# Patient Record
Sex: Female | Born: 1995 | Race: Black or African American | Hispanic: No | Marital: Single
Health system: Southern US, Community
[De-identification: ages and names within clinical notes are randomized; demographics above are authoritative.]

## PROBLEM LIST (undated history)

## (undated) DIAGNOSIS — F419 Anxiety disorder, unspecified: Secondary | ICD-10-CM

## (undated) NOTE — Progress Notes (Signed)
Formatting of this note is different from the original.  Subjective     Patient ID:  Destiny Rojas is a 70 y.o. (DOB Feb 26, 1996) female.     Patient presents with   ? Anxiety     runs in family, aunts have told her about the Ativan   ? Chest Pain     was transported to ER 2 weeks ago and again 1 week ago eval by EMS. Was given Ativan at ER. No CXR or ECG. If not having anxiety may not have the CP. Last episode, was in the exam room, lasted seconds, may last minutes. No diaphoresis. Does feel nervous, sometimes a headache. Does seem to radiate to left chest.     Past Medical History, Past Surgery History, Allergies, Social History, and Family History were reviewed and updated.      Review of Systems is negative except as noted.    Objective     BP 125/80  Pulse 75  Temp(Src) 98.5 F (36.9 C) (Tympanic)  Resp 16  Ht 5\' 7"  (1.702 m)  Wt 258 lb 9.6 oz (117.3 kg)  BMI 40.49 kg/m2  SpO2 98%  LMP 04/17/2013  General:  Well Developed, Well Nourished, No distress  Head, Eyes, Ears, Nose, Throat: Normocephalic, atraumatic, Conjunctiva normal, Nares normal, Oropharynx moist and clear  Neck:  No thyromegally, No lymphadenopathy   Cardiovascular:  S1S2, regular, rate, and rhythm without murmurs, gallops, or rubs  Lungs:  Clear to auscultation with normal effort  Abdomen:  Bowel sounds active, soft, non-tender, non-distended, no hepatosplenomegaly or masses          Skin:  No Focal Rashes   Extremities:  No clubbing, cyanosis, or edema.  Neurologic:  No focal deficits      Assessment     1. Anxiety    2. Palpitations      Plan     Patient's Medications   New Prescriptions    PAROXETINE (PAXIL) 10 MG TABLET    Take 1 tablet (10 mg total) by mouth every morning.   Previous Medications    No medications on file   Modified Medications    Modified Medication Previous Medication    LORAZEPAM (ATIVAN) 1 MG TABLET LORazepam (ATIVAN) 1 MG tablet       Take 1 tablet (1 mg total) by mouth every 6 (six) hours as needed for Anxiety.     Take 1 mg by mouth every 6 (six) hours as needed for Anxiety.   Discontinued Medications    No medications on file     Orders Placed This Encounter   Procedures   ? TSH   ? POCT CBC W/DIFF   ? ECG 12 lead     Plan follow-up as discussed or as needed if any worsening symptoms or change in condition, and patient expressed understanding .     We discussed the "fight or flight" response and her symptoms being related to this natural response when there is no real threat.    While awaiting the results of the CBC she called her grandmother who told her that she is taking paroxetine and that is works great for her. We discussed this and decided to start a low dose.        Electronically signed by Lavell Islam, MD at 04/19/2013  2:35 PM EDT

## (undated) NOTE — Progress Notes (Signed)
Formatting of this note is different from the original.  Subjective:      Destiny Rojas is a 85 y.o. female who presents for follow up of anxiety disorder. She has the following anxiety symptoms: fatigue, feelings of losing control, irritable, panic attacks and racing thoughts. Onset of symptoms was approximately 2 years ago. Symptoms have been unchanged since that time. She denies current suicidal and homicidal ideation. Patient has been a patient of Dr. Andi Devon and is here for refills- she has been off of her medications for many months now and is hoping to re-establish with Dr. Andi Devon and get back on her medications.    The following portions of the patient's history were reviewed and updated as appropriate: allergies, current medications, past family history, past medical history, past social history, past surgical history and problem list.    Review of Systems  A comprehensive review of systems was negative.     Objective:      General appearance: alert, appears stated age and cooperative  Lungs: clear to auscultation bilaterally  Heart: regular rate and rhythm, S1, S2 normal, no murmur, click, rub or gallop     Assessment:      anxiety disorder. Possible organic contributing causes are: none.     Plan:      Handouts describing disease, natural history, and treatment were given to the patient.  Instructed patient to contact office or on-call physician promptly should condition worsen or any new symptoms appear and provided on-call telephone numbers. IF THE PATIENT HAS ANY SUICIDAL OR HOMICIDAL IDEATIONS, CALL THE OFFICE, DISCUSS WITH A SUPPORT MEMBER, OR GO TO THE ER IMMEDIATELY. Patient was agreeable with this plan.  Patient given Dr. Victoriano Lain new office location as patient would like to re-establish with him   Electronically signed by Heron Nay, PA at 11/22/2014  1:12 PM EST

---

## 2013-04-01 ENCOUNTER — Emergency Department (HOSPITAL_COMMUNITY)
Admission: EM | Admit: 2013-04-01 | Discharge: 2013-04-01 | Disposition: A | Discharge status: Home / Self Care | Payer: BC Managed Care – PPO | Attending: Emergency Medicine | Admitting: Emergency Medicine

## 2013-04-01 ENCOUNTER — Encounter (HOSPITAL_COMMUNITY): Payer: Self-pay

## 2013-04-01 DIAGNOSIS — F411 Generalized anxiety disorder: Secondary | ICD-10-CM | POA: Insufficient documentation

## 2013-04-01 DIAGNOSIS — F419 Anxiety disorder, unspecified: Secondary | ICD-10-CM

## 2013-04-01 HISTORY — DX: Anxiety disorder, unspecified: F41.9

## 2013-04-01 MED ORDER — LORAZEPAM 1 MG PO TABS
1.0000 mg | ORAL_TABLET | Freq: Once | ORAL | Status: AC
  Administered 2013-04-01: 1 mg via ORAL
  Filled 2013-04-01: qty 1

## 2013-04-01 MED ORDER — LORAZEPAM 1 MG PO TABS: 1.0000 mg | ORAL_TABLET | Freq: Three times a day (TID) | ORAL | Status: DC | PRN

## 2013-04-01 NOTE — ED Notes (Signed)
[id number]<WN> Expected date:<BR> Expected time:<BR> Means of arrival:<BR> Comments:<BR> anxiety

## 2013-04-01 NOTE — ED Provider Notes (Signed)
   History    CSN: [id number] Arrival date & time 04/01/13  0803  First MD Initiated Contact with Patient 04/01/13 [phone]     Chief Complaint  Patient presents with  . Anxiety   (Consider location/radiation/quality/duration/timing/severity/associated sxs/prior Treatment) HPI Comments: Patient presents with a chief complaint of anxiety.  She reports that she has been anxious for the past month and has been having difficulty sleeping.  Anxiety worsened last evening.  She denies any precipitating factors.  She reports that she has a history of Anxiety.  She states that she was seen in November of 2013 by Urgent Care for the same complaint.  She states that at that time she was prescribed Ativan for the anxiety.  However, she states that she did not feel that she needed the Ativan and was able to manage her anxiety without it.  The prescription for Ativan has since expired.  She denies SI or HI.  She currently does not have a Paramedic or a Therapist, sports.  She denies any alcohol or recreational drug use.  The history is provided by the patient.   Past Medical History  Diagnosis Date  . Anxiety    History reviewed. No pertinent past surgical history. No family history on file. History  Substance Use Topics  . Smoking status: Not on file  . Smokeless tobacco: Not on file  . Alcohol Use: Not on file   OB History   Grav Para Term Preterm Abortions TAB SAB Ect Mult Living                 Review of Systems  Psychiatric/Behavioral: Positive for sleep disturbance. Negative for suicidal ideas, hallucinations, confusion and decreased concentration. The patient is nervous/anxious.   All other systems reviewed and are negative.    Allergies  Review of patient's allergies indicates no known allergies.  Home Medications  No current outpatient prescriptions on file. BP 124/76  Pulse 87  Temp(Src) 98.6 F (37 C) (Oral)  Resp 14  SpO2 97% Physical Exam  Nursing note and vitals  reviewed. Constitutional: She appears well-developed and well-nourished.  HENT:  Head: Normocephalic and atraumatic.  Neck: Normal range of motion. Neck supple.  Cardiovascular: Normal rate, regular rhythm and normal heart sounds.   Pulmonary/Chest: Effort normal and breath sounds normal.  Neurological: She is alert. She has normal strength. No cranial nerve deficit or sensory deficit. Coordination and gait normal.  Skin: Skin is warm and dry.  Psychiatric: Her speech is normal and behavior is normal. Her mood appears anxious. She is not actively hallucinating. She expresses no homicidal and no suicidal ideation. She expresses no suicidal plans and no homicidal plans.    ED Course  Procedures (including critical care time) Labs Reviewed - No data to display No results found. No diagnosis found.  8:44 AM Patient reports that her anxiety has improved at this time. MDM  Patient presenting with a chief complaint of anxiety.  She denies SI or HI.  Symptoms improved after given oral Ativan in the ED.  Patient given prescription for five Ativan to take as needed for anxiety.  Patient explained that Ativan is not an appropriate treatment for long term management of anxiety.  Discussed with patient other methods of coping with anxiety.  Patient also given resource guide.  Return precautions given to patient.    Pascal Lux [place], PA-C 04/01/13 [phone]

## 2013-04-01 NOTE — ED Provider Notes (Signed)
Medical screening examination/treatment/procedure(s) were performed by non-physician practitioner and as supervising physician I was immediately available for consultation/collaboration.    Liviya Santini R Godric Lavell, MD 04/01/13 1602 

## 2013-04-01 NOTE — ED Notes (Signed)
Per EMS pt c/o anxiety since last pm, tightness in arm and can't sleep x59month. Denies SI/HI

## 2013-04-29 ENCOUNTER — Encounter (HOSPITAL_COMMUNITY): Payer: Self-pay | Admitting: Emergency Medicine

## 2013-04-29 ENCOUNTER — Emergency Department (HOSPITAL_COMMUNITY)
Admission: EM | Admit: 2013-04-29 | Discharge: 2013-04-29 | Disposition: A | Discharge status: Home / Self Care | Payer: BC Managed Care – PPO | Attending: Emergency Medicine | Admitting: Emergency Medicine

## 2013-04-29 DIAGNOSIS — Z79899 Other long term (current) drug therapy: Secondary | ICD-10-CM | POA: Insufficient documentation

## 2013-04-29 DIAGNOSIS — F411 Generalized anxiety disorder: Secondary | ICD-10-CM | POA: Insufficient documentation

## 2013-04-29 DIAGNOSIS — F419 Anxiety disorder, unspecified: Secondary | ICD-10-CM

## 2013-04-29 NOTE — ED Notes (Signed)
Pt states she has a hx of anxiety  Pt states she sometimes gets pain in her shoulders mainly the left side and sometimes it goes to her chest  Pt states she was seen by her dr last week for same and they did labwork and an ekg that were normal  Pt states she just wants to be checked out to be sure she is ok

## 2013-04-29 NOTE — ED Provider Notes (Signed)
CSN: [id number]     Arrival date & time 04/29/13  2151 History    This chart was scribed Roxy Horseman, for non-physician practitioner working with Laray Anger, DO by Leone Payor, ED Scribe. This patient was seen in room WTR8/WTR8 and the patient's care was started at 2151.   First MD Initiated Contact with Patient 04/29/13 2216     Chief Complaint  Patient presents with  . Anxiety    HPI  HPI Comments: Sierra Payne is a 17 y.o. female with past medical history of anxiety who presents to the Emergency Department complaining of a new episode of anxiety. States she has left arm pain that moves to the chest. She denies having associated SOB or diaphoresis with the chest pains. She denies having syncopal episodes. She denies problems with walking. She was recently seen and had an EKG and labwork that were normal. Pt denies high cholesterol, DM, HTN. Pt denies family history of cardiac problems.    Past Medical History  Diagnosis Date  . Anxiety   . Anxiety    History reviewed. No pertinent past surgical history. Family History  Problem Relation Age of Onset  . Hypertension Other   . Anxiety disorder Other    History  Substance Use Topics  . Smoking status: Not on file  . Smokeless tobacco: Not on file  . Alcohol Use: No   OB History   Grav Para Term Preterm Abortions TAB SAB Ect Mult Living                 Review of Systems  Allergies  Review of patient's allergies indicates no known allergies.  Home Medications   Current Outpatient Rx  Name  Route  Sig  Dispense  Refill  . LORazepam (ATIVAN) 1 MG tablet   Oral   Take 1 tablet (1 mg total) by mouth 3 (three) times daily as needed for anxiety.   5 tablet   0   . PARoxetine (PAXIL) 10 MG tablet   Oral   Take 10 mg by mouth every morning.          BP 136/66  Pulse 80  Temp(Src) 98.8 F (37.1 C) (Oral)  Resp 20  Ht 5\' 7"  (1.702 m)  Wt 250 lb (113.399 kg)  BMI 39.15 kg/m2  SpO2 100%  LMP  04/14/2013 Physical Exam  Nursing note and vitals reviewed. Constitutional: She is oriented to person, place, and time. She appears well-developed and well-nourished.  HENT:  Head: Normocephalic and atraumatic.  Eyes: Conjunctivae and EOM are normal. Pupils are equal, round, and reactive to light.  Neck: Normal range of motion. Neck supple.  Cardiovascular: Normal rate, regular rhythm and normal heart sounds.  Exam reveals no gallop and no friction rub.   No murmur heard. Pulmonary/Chest: Effort normal and breath sounds normal. No respiratory distress. She has no wheezes. She has no rales. She exhibits no tenderness.  Abdominal: Soft. Bowel sounds are normal.  Musculoskeletal: Normal range of motion.  Moves all extremities. Able to ambulate.   Neurological: She is alert and oriented to person, place, and time.  Skin: Skin is warm and dry.  Psychiatric: She has a normal mood and affect.    ED Course   Procedures (including critical care time)  DIAGNOSTIC STUDIES: Oxygen Saturation is 100% on RA, normal by my interpretation.    COORDINATION OF CARE: 10:46 PM Discussed treatment plan with pt at bedside and pt agreed to plan.  ED ECG REPORT  I personally interpreted this EKG   Date: 04/30/2013   Rate: 73  Rhythm: normal sinus rhythm  QRS Axis: normal  Intervals: normal  ST/T Wave abnormalities: normal  Conduction Disutrbances:none  Narrative Interpretation:   Old EKG Reviewed: none available   Labs Reviewed - No data to display No results found. 1. Anxiety     MDM  Patient with history of anxiety. She states that occasionally she gets chest pain, and she is concerned about it. She is not actually having any chest pain now. Chest history of anxiety. She was recently seen by her PCP, and had lab work which was unremarkable as well as an EKG which was normal. She has 0 cardiac risk factors. TIMI score is 0. EKG today is normal. PERC negative.  I personally performed the  services described in this documentation, which was scribed in my presence. The recorded information has been reviewed and is accurate.    Roxy Horseman, PA-C 04/30/13 0015

## 2013-05-01 NOTE — ED Provider Notes (Signed)
Medical screening examination/treatment/procedure(s) were performed by non-physician practitioner and as supervising physician I was immediately available for consultation/collaboration.   Laray Anger, DO 05/01/13 1623

## 2013-07-23 ENCOUNTER — Emergency Department (HOSPITAL_COMMUNITY)
Admission: EM | Admit: 2013-07-23 | Discharge: 2013-07-24 | Disposition: A | Discharge status: Home / Self Care | Payer: BC Managed Care – PPO | Attending: Emergency Medicine | Admitting: Emergency Medicine

## 2013-07-23 ENCOUNTER — Encounter (HOSPITAL_COMMUNITY): Payer: Self-pay | Admitting: Emergency Medicine

## 2013-07-23 ENCOUNTER — Emergency Department (HOSPITAL_COMMUNITY): Payer: BC Managed Care – PPO

## 2013-07-23 DIAGNOSIS — Z79899 Other long term (current) drug therapy: Secondary | ICD-10-CM | POA: Insufficient documentation

## 2013-07-23 DIAGNOSIS — IMO0001 Reserved for inherently not codable concepts without codable children: Secondary | ICD-10-CM | POA: Insufficient documentation

## 2013-07-23 DIAGNOSIS — Z3202 Encounter for pregnancy test, result negative: Secondary | ICD-10-CM | POA: Insufficient documentation

## 2013-07-23 DIAGNOSIS — F411 Generalized anxiety disorder: Secondary | ICD-10-CM | POA: Insufficient documentation

## 2013-07-23 DIAGNOSIS — M549 Dorsalgia, unspecified: Secondary | ICD-10-CM | POA: Insufficient documentation

## 2013-07-23 DIAGNOSIS — M7918 Myalgia, other site: Secondary | ICD-10-CM

## 2013-07-23 DIAGNOSIS — R002 Palpitations: Secondary | ICD-10-CM | POA: Insufficient documentation

## 2013-07-23 LAB — CBC
HCT: 38.9 % (ref 36.0–46.0)
MCV: 84.7 fL (ref 78.0–100.0)
RBC: 4.59 MIL/uL (ref 3.87–5.11)
WBC: 8.8 10*3/uL (ref 4.0–10.5)

## 2013-07-23 LAB — BASIC METABOLIC PANEL
CO2: 25 mEq/L (ref 19–32)
Calcium: 9.5 mg/dL (ref 8.4–10.5)
Chloride: 101 mEq/L (ref 96–112)
GFR calc Af Amer: >90 mL/min (ref >90)
GFR calc non Af Amer: >90 mL/min (ref >90)

## 2013-07-23 LAB — POCT PREGNANCY, URINE: Preg Test, Ur: NEGATIVE

## 2013-07-23 LAB — POCT I-STAT TROPONIN I: Troponin i, poc: 0 ng/mL (ref 0.00–0.08)

## 2013-07-23 MED ORDER — IBUPROFEN 800 MG PO TABS: 800.0000 mg | ORAL_TABLET | Freq: Three times a day (TID) | ORAL | Status: AC

## 2013-07-23 MED ORDER — IBUPROFEN 800 MG PO TABS
800.0000 mg | ORAL_TABLET | Freq: Once | ORAL | Status: AC
Start: 2013-07-23 — End: 2013-07-23
  Administered 2013-07-23: 800 mg via ORAL
  Filled 2013-07-23: qty 1

## 2013-07-23 NOTE — ED Notes (Signed)
Pt states she began having pain in chest two days ago along with back pain. Rates back pain 7/10 and chest pain 6/10.

## 2013-07-23 NOTE — ED Provider Notes (Signed)
CSN: [id number]     Arrival date & time 07/23/13  2201 History   First MD Initiated Contact with Patient 07/23/13 2225     Chief Complaint  Patient presents with  . Chest Pain   (Consider location/radiation/quality/duration/timing/severity/associated sxs/prior Treatment) HPI Comments: Patient is a 17 -year-old female with a past medical history anxiety presents to the emergency department complaining of gradual onset back pain and chest pain x2 days. Back pain has been constant, more so on the left upper aspect, worse with movement rated 7/10. Chest pain has been intermittent, located in the center of her chest, nonradiating rated 6/10. She has not had any alleviating factors for her symptoms. Admits to occasional palpitations. Denies associated shortness of breath, nausea, vomiting, cough, fever or chills. She has had chest pain in the past related to anxiety, this is similar, however back pain was not present in the past. Reports she has been "sleeping funny" on the left side of her back which may relate to her pain. Denies any increased stress or anxiety. Admits to doing heavy lifting recently, sits at a desk throughout the day at school. Nonsmoker, no family history of early heart disease.  Patient is a 17 y.o. female presenting with chest pain. The history is provided by the patient.  Chest Pain Associated symptoms: back pain and palpitations   Associated symptoms: no cough, no fever, no nausea, no shortness of breath and not vomiting     Past Medical History  Diagnosis Date  . Anxiety   . Anxiety    History reviewed. No pertinent past surgical history. Family History  Problem Relation Age of Onset  . Hypertension Other   . Anxiety disorder Other    History  Substance Use Topics  . Smoking status: Never Smoker   . Smokeless tobacco: Not on file  . Alcohol Use: No   OB History   Grav Para Term Preterm Abortions TAB SAB Ect Mult Living                 Review of Systems    Constitutional: Negative for fever and chills.  Respiratory: Negative for cough and shortness of breath.   Cardiovascular: Positive for chest pain and palpitations.  Gastrointestinal: Negative for nausea and vomiting.  Musculoskeletal: Positive for back pain.  Skin: Negative for rash.  Neurological: Negative for syncope and light-headedness.  All other systems reviewed and are negative.    Allergies  Review of patient's allergies indicates no known allergies.  Home Medications   Current Outpatient Rx  Name  Route  Sig  Dispense  Refill  . Multiple Vitamins-Minerals (MULTIVITAMIN WITH MINERALS) tablet   Oral   Take 1 tablet by mouth daily.         Marland Kitchen PARoxetine (PAXIL) 10 MG tablet   Oral   Take 10 mg by mouth every morning.          BP 120/84  Pulse 77  Temp(Src) 98.8 F (37.1 C) (Oral)  Resp 16  SpO2 100% Physical Exam  Nursing note and vitals reviewed. Constitutional: She is oriented to person, place, and time. She appears well-developed and well-nourished. No distress.  HENT:  Head: Normocephalic and atraumatic.  Mouth/Throat: Oropharynx is clear and moist.  Eyes: Conjunctivae and EOM are normal. Pupils are equal, round, and reactive to light.  Neck: Normal range of motion. Neck supple.  Cardiovascular: Normal rate, regular rhythm, normal heart sounds and intact distal pulses.   Pulmonary/Chest: Effort normal and breath sounds normal. No  respiratory distress. She has no wheezes. She has no rales. She exhibits no tenderness.  Abdominal: Soft. Bowel sounds are normal. There is no tenderness.  Musculoskeletal: Normal range of motion. She exhibits no edema.       Back:  Tenderness to palpation of left scapular region.  Neurological: She is alert and oriented to person, place, and time.  Skin: Skin is warm and dry. She is not diaphoretic.  Psychiatric: She has a normal mood and affect. Her behavior is normal.    ED Course  Procedures (including critical care  time) Labs Review Labs Reviewed  BASIC METABOLIC PANEL - Abnormal; Notable for the following:    Sodium 133 (*)    Glucose, Bld 116 (*)    All other components within normal limits  CBC  POCT PREGNANCY, URINE  POCT I-STAT TROPONIN I   Imaging Review Dg Chest 2 View  07/23/2013   CLINICAL DATA:  Chest pain  EXAM: CHEST  2 VIEW  COMPARISON:  None.  FINDINGS: The heart size and mediastinal contours are within normal limits. Both lungs are clear. The visualized skeletal structures are unremarkable.  IMPRESSION: No active cardiopulmonary disease.   Electronically Signed   By: Marlan Palau M.D.   On: 07/23/2013 22:59    EKG Interpretation   None       MDM   1. Back pain   2. Musculoskeletal pain    Patient with back pain and chest pain. Back pain reproducible over scapular region. Chest pain not there at present. She is well appearing and in NAD with normal vital signs. No cardiac risk factors. PERC negative. Back pain improved with ibuprofen. Labs, CXR, EKG normal. Discharge home with ibuprofen. F/u with PCP. Return precautions given. Patient states understanding of treatment care plan and is agreeable.     Trevor Mace, PA-C 07/24/13 0006

## 2013-07-24 NOTE — ED Provider Notes (Signed)
Medical screening examination/treatment/procedure(s) were performed by non-physician practitioner and as supervising physician I was immediately available for consultation/collaboration.   Shanna Cisco, MD 07/24/13 [phone]

## 2014-09-16 ENCOUNTER — Emergency Department (HOSPITAL_COMMUNITY)
Admission: EM | Admit: 2014-09-16 | Discharge: 2014-09-17 | Disposition: A | Discharge status: Home / Self Care | Payer: BC Managed Care – PPO | Attending: Emergency Medicine | Admitting: Emergency Medicine

## 2014-09-16 ENCOUNTER — Encounter (HOSPITAL_COMMUNITY): Payer: Self-pay | Admitting: Emergency Medicine

## 2014-09-16 DIAGNOSIS — E876 Hypokalemia: Secondary | ICD-10-CM | POA: Diagnosis not present

## 2014-09-16 DIAGNOSIS — F419 Anxiety disorder, unspecified: Secondary | ICD-10-CM | POA: Insufficient documentation

## 2014-09-16 DIAGNOSIS — R1084 Generalized abdominal pain: Secondary | ICD-10-CM | POA: Insufficient documentation

## 2014-09-16 DIAGNOSIS — R197 Diarrhea, unspecified: Secondary | ICD-10-CM | POA: Insufficient documentation

## 2014-09-16 DIAGNOSIS — R112 Nausea with vomiting, unspecified: Secondary | ICD-10-CM

## 2014-09-16 DIAGNOSIS — Z3202 Encounter for pregnancy test, result negative: Secondary | ICD-10-CM | POA: Diagnosis not present

## 2014-09-16 LAB — COMPREHENSIVE METABOLIC PANEL
ALT: 12 U/L (ref 0–35)
AST: 16 U/L (ref 0–37)
Albumin: 3.6 g/dL (ref 3.5–5.2)
Alkaline Phosphatase: 68 U/L (ref 39–117)
Anion gap: 11 (ref 5–15)
BUN: 5 mg/dL — ABNORMAL LOW (ref 6–23)
CO2: 22 mEq/L (ref 19–32)
Calcium: 9.1 mg/dL (ref 8.4–10.5)
Chloride: 101 mEq/L (ref 96–112)
Creatinine, Ser: 0.83 mg/dL (ref 0.50–1.10)
GFR calc Af Amer: >90 mL/min (ref >90)
GFR calc non Af Amer: >90 mL/min (ref >90)
Glucose, Bld: 108 mg/dL — ABNORMAL HIGH (ref 70–99)
Potassium: 2.9 mEq/L — CL (ref 3.7–5.3)
Sodium: 134 mEq/L — ABNORMAL LOW (ref 137–147)
Total Bilirubin: 0.3 mg/dL (ref 0.3–1.2)
Total Protein: 7.5 g/dL (ref 6.0–8.3)

## 2014-09-16 LAB — CBC WITH DIFFERENTIAL/PLATELET
Basophils Absolute: 0 10*3/uL (ref 0.0–0.1)
Basophils Relative: 0 % (ref 0–1)
Eosinophils Absolute: 0.1 10*3/uL (ref 0.0–0.7)
Eosinophils Relative: 1 % (ref 0–5)
HCT: 38.2 % (ref 36.0–46.0)
Hemoglobin: 12.7 g/dL (ref 12.0–15.0)
Lymphocytes Relative: 35 % (ref 12–46)
Lymphs Abs: 2.3 10*3/uL (ref 0.7–4.0)
MCH: 28.5 pg (ref 26.0–34.0)
MCHC: 33.2 g/dL (ref 30.0–36.0)
MCV: 85.7 fL (ref 78.0–100.0)
Monocytes Absolute: 0.5 10*3/uL (ref 0.1–1.0)
Monocytes Relative: 7 % (ref 3–12)
Neutro Abs: 3.7 10*3/uL (ref 1.7–7.7)
Neutrophils Relative %: 57 % (ref 43–77)
Platelets: 366 10*3/uL (ref 150–400)
RBC: 4.46 MIL/uL (ref 3.87–5.11)
RDW: 13.4 % (ref 11.5–15.5)
WBC: 6.5 10*3/uL (ref 4.0–10.5)

## 2014-09-16 LAB — URINALYSIS, ROUTINE W REFLEX MICROSCOPIC
Bilirubin Urine: NEGATIVE
Glucose, UA: NEGATIVE mg/dL
Hgb urine dipstick: NEGATIVE
Ketones, ur: NEGATIVE mg/dL
Leukocytes, UA: NEGATIVE
Nitrite: NEGATIVE
Protein, ur: NEGATIVE mg/dL
Specific Gravity, Urine: 1.003 — ABNORMAL LOW (ref 1.005–1.030)
Urobilinogen, UA: 0.2 mg/dL (ref 0.0–1.0)
pH: 7 (ref 5.0–8.0)

## 2014-09-16 LAB — LIPASE, BLOOD: Lipase: 18 U/L (ref 11–59)

## 2014-09-16 LAB — POC URINE PREG, ED: Preg Test, Ur: NEGATIVE

## 2014-09-16 MED ORDER — POTASSIUM CHLORIDE CRYS ER 20 MEQ PO TBCR
40.0000 meq | EXTENDED_RELEASE_TABLET | Freq: Once | ORAL | Status: AC
  Administered 2014-09-16: 40 meq via ORAL
  Filled 2014-09-16: qty 2

## 2014-09-16 MED ORDER — BISMUTH SUBSALICYLATE 262 MG PO CHEW: 524.0000 mg | CHEWABLE_TABLET | ORAL | Status: AC | PRN

## 2014-09-16 MED ORDER — POTASSIUM CHLORIDE 10 MEQ/100ML IV SOLN
10.0000 meq | Freq: Once | INTRAVENOUS | Status: AC
  Administered 2014-09-16: 10 meq via INTRAVENOUS
  Filled 2014-09-16: qty 100

## 2014-09-16 MED ORDER — POTASSIUM CHLORIDE CRYS ER 20 MEQ PO TBCR: 20.0000 meq | EXTENDED_RELEASE_TABLET | Freq: Every day | ORAL | Status: AC

## 2014-09-16 MED ORDER — PROMETHAZINE HCL 25 MG PO TABS: 25.0000 mg | ORAL_TABLET | Freq: Four times a day (QID) | ORAL | Status: AC | PRN

## 2014-09-16 MED ORDER — SODIUM CHLORIDE 0.9 % IV BOLUS (SEPSIS)
1000.0000 mL | Freq: Once | INTRAVENOUS | Status: AC
  Administered 2014-09-16: 1000 mL via INTRAVENOUS

## 2014-09-16 NOTE — ED Notes (Signed)
Pt states she started having a small amount of diarrhea couple days ago, then today have 1 episode of vomit, states abdominal pain started today also.

## 2014-09-16 NOTE — ED Notes (Signed)
Pt refused me taking vitals

## 2014-09-16 NOTE — ED Notes (Signed)
Patient reports constant diarrhea for 5 days and starting again today. C/o "sharp" mid abdominal pains starting today. Emesis x1, denies blood. Denies blood in diarrhea. Denies fever, chills. Felt nauseous today while eating food. Also hx anxiety. Denies hx DM. Patient denies any chance she could be pregnant-is not on birth control. Feeling anxious at this time. RR even/unlabored. Hasn't tried any OTC medications. No medications taken today.

## 2014-09-16 NOTE — ED Provider Notes (Signed)
CSN: [id number]     Arrival date & time 09/16/14  2000 History   First MD Initiated Contact with Patient 09/16/14 2105     Chief Complaint  Patient presents with  . Nausea  . Emesis  . Diarrhea  . Abdominal Pain     (Consider location/radiation/quality/duration/timing/severity/associated sxs/prior Treatment) HPI  Pt is a [place]18yo female with hx of anxiety, presenting to ED with c/o "constant" diarrhea for 5 days last week, which resolved but then seemed to come back today with associated mid abdominal pain that was sharp in nature. Pain is 8/10 at worst, sharp and shooting.  She reports having 1 "normal" firm BM today and 1 episode of watery diarrhea today. Denies blood or mucous in stool.   Pt also reports nausea and 1 episode of vomiting today.  States she felt nauseated and anxious earlier today while eating. Reports previous hx of anxiety.  Not currently on medications for her anxiety, states she "weened" herself off of her medications. Denies fever, chills, sick contacts or recent travel. No medications taken at home PTA.  Denies urinary or vaginal symptoms.   Past Medical History  Diagnosis Date  . Anxiety   . Anxiety    History reviewed. No pertinent past surgical history. Family History  Problem Relation Age of Onset  . Hypertension Other   . Anxiety disorder Other    History  Substance Use Topics  . Smoking status: Never Smoker   . Smokeless tobacco: Not on file  . Alcohol Use: No   OB History    No data available     Review of Systems  Constitutional: Negative for fever, chills and appetite change.  Respiratory: Negative for cough and shortness of breath.   Gastrointestinal: Positive for nausea, vomiting, abdominal pain ( lower ) and diarrhea.  Genitourinary: Negative for dysuria, frequency, hematuria, flank pain, decreased urine volume, vaginal bleeding, vaginal discharge, vaginal pain and pelvic pain.  Musculoskeletal: Negative for myalgias and back pain.   Psychiatric/Behavioral: The patient is nervous/anxious.   All other systems reviewed and are negative.     Allergies  Review of patient's allergies indicates no known allergies.  Home Medications   Prior to Admission medications   Medication Sig Start Date End Date Taking? Authorizing Provider  bismuth subsalicylate (PEPTO-BISMOL) 262 MG chewable tablet Chew 2 tablets (524 mg total) by mouth as needed for diarrhea or loose stools. Every 0.5 to 1hr as needed for diarrhea and upset stomach, do not exceed 8 pills per day 09/16/14   Junius FinnerErin O'Malley, PA-C  ibuprofen (ADVIL,MOTRIN) 800 MG tablet Take 1 tablet (800 mg total) by mouth 3 (three) times daily. Patient not taking: Reported on 09/16/2014 07/23/13   Nada Boozerobyn M Hess, PA-C  potassium chloride SA (K-DUR,KLOR-CON) 20 MEQ tablet Take 1 tablet (20 mEq total) by mouth daily. 09/16/14   Junius FinnerErin O'Malley, PA-C  promethazine (PHENERGAN) 25 MG tablet Take 1 tablet (25 mg total) by mouth every 6 (six) hours as needed for nausea or vomiting. 09/16/14   Junius FinnerErin O'Malley, PA-C   BP 138/77 mmHg  Pulse 85  Temp(Src) 98.4 F (36.9 C) (Oral)  Resp 20  SpO2 100%  LMP 09/10/2014 (Approximate) Physical Exam  Constitutional: She appears well-developed and well-nourished. No distress.  Pt lying comfortably in exam bed, NAD.  HENT:  Head: Normocephalic and atraumatic.  Eyes: Conjunctivae are normal. No scleral icterus.  Neck: Normal range of motion.  Cardiovascular: Normal rate, regular rhythm and normal heart sounds.   Pulmonary/Chest: Effort normal  and breath sounds normal. No respiratory distress. She has no wheezes. She has no rales. She exhibits no tenderness.  Abdominal: Soft. Bowel sounds are normal. She exhibits no distension and no mass. There is no tenderness. There is no rebound and no guarding.  Obese abdomen, soft, non-tender.  Musculoskeletal: Normal range of motion.  Neurological: She is alert.  Skin: Skin is warm and dry. She is not  diaphoretic.  Nursing note and vitals reviewed.   ED Course  Procedures (including critical care time) Labs Review Labs Reviewed  COMPREHENSIVE METABOLIC PANEL - Abnormal; Notable for the following:    Sodium 134 (*)    Potassium 2.9 (*)    Glucose, Bld 108 (*)    BUN 5 (*)    All other components within normal limits  URINALYSIS, ROUTINE W REFLEX MICROSCOPIC - Abnormal; Notable for the following:    Specific Gravity, Urine 1.003 (*)    All other components within normal limits  CBC WITH DIFFERENTIAL  LIPASE, BLOOD  POC URINE PREG, ED    Imaging Review No results found.   EKG Interpretation   Date/Time:  Tuesday September 16 2014 22:52:27 EST Ventricular Rate:  88 PR Interval:  131 QRS Duration: 85 QT Interval:  364 QTC Calculation: 440 R Axis:   46 Text Interpretation:  Sinus rhythm Low voltage, precordial leads  Borderline T abnormalities, anterior leads Otherwise no significant change  Confirmed by HARRISON  MD, FORREST ([id number]) on 09/16/2014 11:01:51 PM      MDM   Final diagnoses:  Generalized abdominal pain  Nausea vomiting and diarrhea  Hypokalemia  Anxiety   Pt is a 18yo female c/o intermittent abdominal pain with associated n/v/d.  On exam, pt is afebrile, abdomen is soft, non-tender.    Labs: negative pregnancy, significant for hypokalemia, otherwise unremarkable.  This is c/w reports of "constant" diarrhea.   Discussed pt with Dr. Romeo AppleHarrison, low concern for emergent process taking place, including but not limited to SBO, bowel rupture, appendicitis, cholecystitis, ectopic pregnancy, or ovarian torsion. CT abdomen not indicated at this time.   Pt given IV fluids, 10mEq IV potassium and 40mEq k-dur. Pt able to keep down keep down PO fluids. Will discharge home and tx for gastritis. Rx: phenergan, k-dur, pepto-bismol. Home care instructions provided. Ad[facility] for ongoing healthcare needs and recheck of symptoms in 2-3 days if not  improving. Return precautions provided. Pt verbalized understanding and agreement with tx plan.    Junius Finnerrin O'Malley, PA-C 09/17/14 0001  Purvis SheffieldForrest Harrison, MD 09/18/14 1302

## 2014-12-07 IMAGING — CR DG CHEST 2V
2 series · 2 of 2 positions shown · non-contrast
Comparison: None.

CLINICAL DATA: Chest pain

EXAM:
CHEST  2 VIEW

[w chest pa]
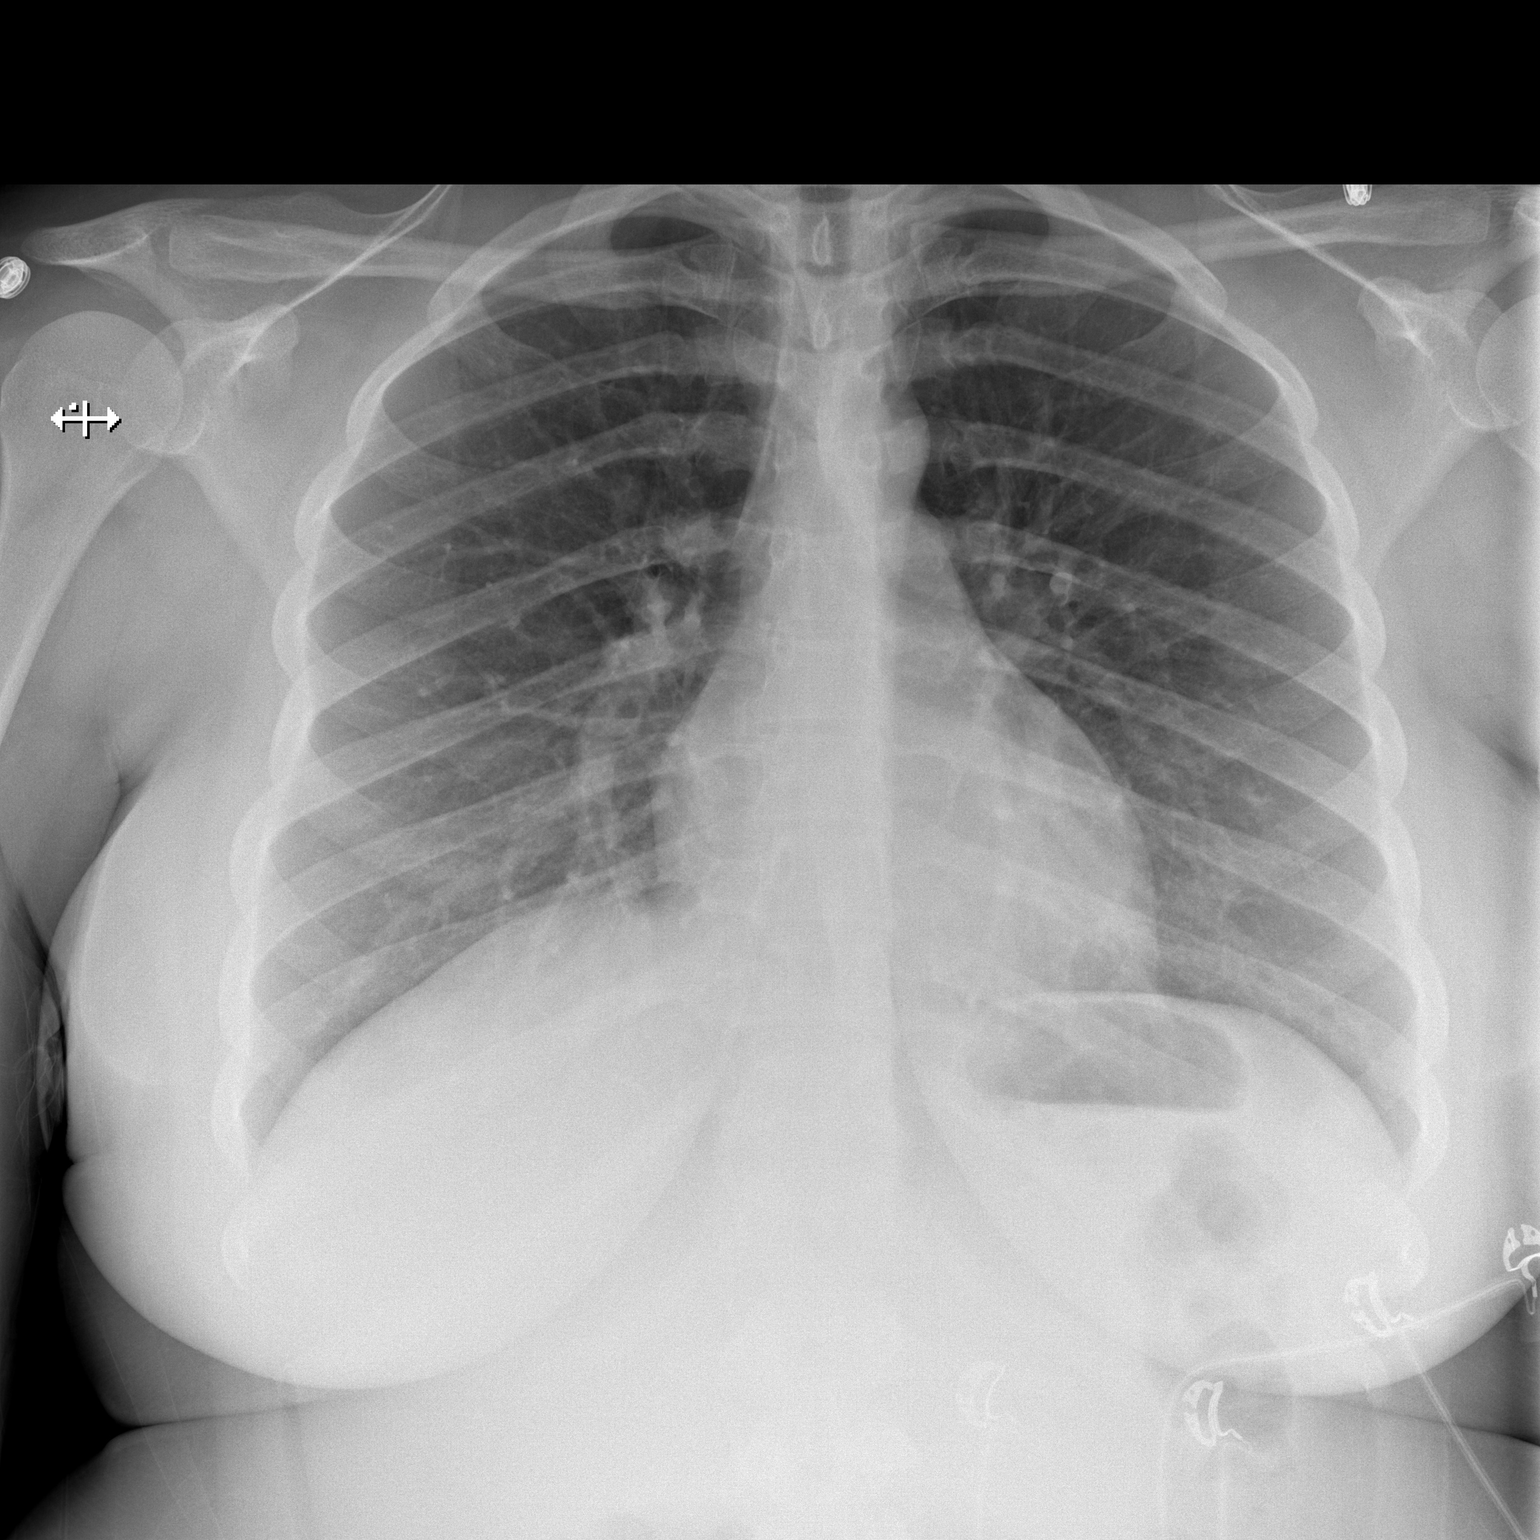

[w chest lat]
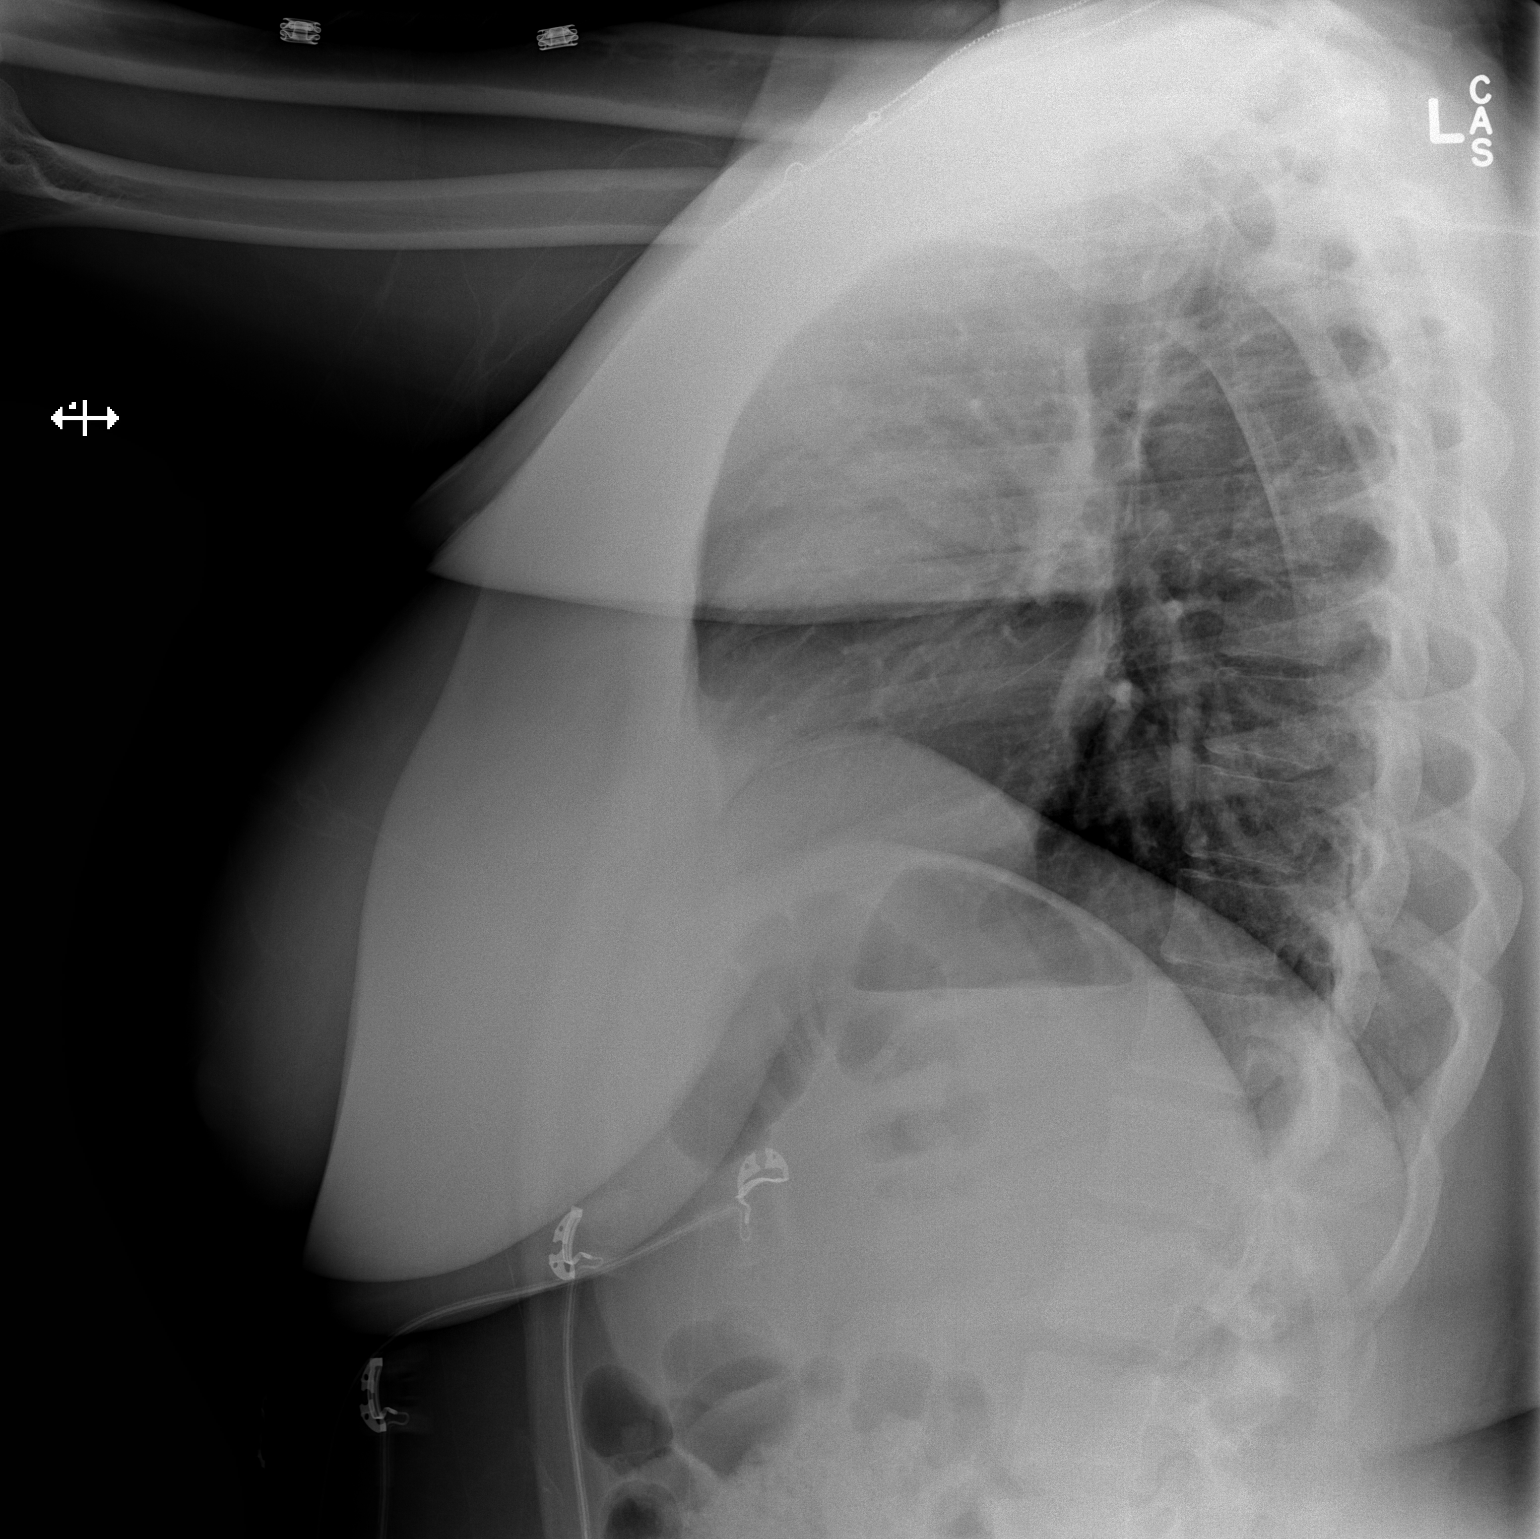

[2 of 2 positions shown; findings below may reference images not displayed]

FINDINGS: The heart size and mediastinal contours are within normal limits.
Both lungs are clear. The visualized skeletal structures are
unremarkable.
IMPRESSION: No active cardiopulmonary disease.

## 2023-05-07 ENCOUNTER — Emergency Department
Admission: EM | Admit: 2023-05-07 | Discharge: 2023-05-07 | Disposition: A | Discharge status: Home / Self Care | Payer: No Typology Code available for payment source | Attending: Emergency Medicine | Admitting: Emergency Medicine

## 2023-05-07 ENCOUNTER — Emergency Department
Admission: EM | Admit: 2023-05-07 | Discharge: 2023-05-07 | Discharge status: Against Medical Advice | Payer: No Typology Code available for payment source | Attending: Emergency Medicine | Admitting: Emergency Medicine

## 2023-05-07 ENCOUNTER — Emergency Department: Payer: No Typology Code available for payment source

## 2023-05-07 DIAGNOSIS — S0181XA Laceration without foreign body of other part of head, initial encounter: Secondary | ICD-10-CM

## 2023-05-07 DIAGNOSIS — Z5321 Procedure and treatment not carried out due to patient leaving prior to being seen by health care provider: Secondary | ICD-10-CM | POA: Insufficient documentation

## 2023-05-07 DIAGNOSIS — S0083XA Contusion of other part of head, initial encounter: Secondary | ICD-10-CM

## 2023-05-07 DIAGNOSIS — Y92410 Unspecified street and highway as the place of occurrence of the external cause: Secondary | ICD-10-CM | POA: Insufficient documentation

## 2023-05-07 DIAGNOSIS — Z23 Encounter for immunization: Secondary | ICD-10-CM | POA: Insufficient documentation

## 2023-05-07 MED ORDER — TETANUS-DIPHTH-ACELL PERTUSSIS 5-2.5-18.5 LF-MCG/0.5 IM SUSP/SUSY (WR)
0.5000 mL | Freq: Once | INTRAMUSCULAR | Status: AC
Start: 2023-05-07 — End: 2023-05-07
  Administered 2023-05-07: 0.5 mL via INTRAMUSCULAR
  Filled 2023-05-07: qty 0.5

## 2023-05-07 NOTE — Discharge Instructions (Addendum)
Discharge Instructions    As always, you are the most important factor in your recovery.  Please follow these instructions carefully.  If you have problems that we have not discussed, CALL OR VISIT YOUR DOCTOR RIGHT AWAY.     If you can't reach your doctor, return to the emergency department.    I Ancil Boozer understand the written and discussed instructions.  My questions have been answered.  I acknowledge receipt of these instructions. I will follow up with the recommended providers as instructed.     Patient or responsible person:         Patient's Signature               Physician or Nurse              Dear Ms. Mollenkopf:    Thank you for choosing the [facility] Emergency Department, the premier emergency department in the [place] area.  I hope your visit today was EXCELLENT.    Specific instructions for your visit today:        If you do not continue to improve or your condition worsens, please contact your doctor or return immediately to the Emergency Department.    Sincerely,  Kopack, Mont Dutton, MD  Attending Emergency Physician  [facility] Emergency Department    ONSITE PHARMACY  Our full service onsite pharmacy is located in the ER waiting room.  Open 7 days a week from 9 am to 11 pm.  We accept all major insurances and prices are competitive with major retailers.  Ask your provider to print your prescriptions down to the pharmacy to speed you on your way home.    OBTAINING A PRIMARY CARE APPOINTMENT    Primary care physicians (PCPs, also known as primary care doctors) are either internists or family medicine doctors. Both types of PCPs focus on health promotion, disease prevention, patient education and counseling, and treatment of acute and chronic medical conditions.    Call for an appointment with a primary care doctor.  Ask to see who is taking new patients.     [facility]  telephone:  [phone]  [email or web address]    DOCTOR REFERRALS  Call [phone]  (available 24 hours a day, 7 days a week) if you need any further referrals and we can help you find a primary care doctor or specialist.  Also, available online at:  [email or web address]    YOUR CONTACT INFORMATION  Before leaving please check with registration to make sure we have an up-to-date contact number.  You can call registration at [phone] to update your information.  For questions about your hospital bill, please call [phone].  For questions about your Emergency Dept Physician bill please call [phone].      FREE HEALTH SERVICES  If you need help with health or social services, please call 2-1-1 for a free referral to resources in your area.  2-1-1 is a free service connecting people with information on health insurance, free clinics, pregnancy, mental health, dental care, food assistance, housing, and substance abuse counseling.  Also, available online at:  http://www.[facility].org    MEDICAL RECORDS AND TESTS  Certain laboratory test results do not come back the same day, for example urine cultures.   We will contact you if other important findings are noted.  Radiology films are often reviewed again to ensure accuracy.  If there is any discrepancy, we will notify you.      Please call [phone] to  pick up a complimentary CD of any radiology studies performed.  If you or your doctor would like to request a copy of your medical records, please call [phone].      ORTHOPEDIC INJURY   Please know that significant injuries can exist even when an initial x-ray is read as normal or negative.  This can occur because some fractures (broken bones) are not initially visible on x-rays.  For this reason, close outpatient follow-up with your primary care doctor or bone specialist (orthopedist) is required.    MEDICATIONS AND FOLLOWUP  Please be aware that some prescription medications can cause drowsiness.  Use caution when driving or operating machinery.    The examination  and treatment you have received in our Emergency Department is provided on an emergency basis, and is not intended to be a substitute for your primary care physician.  It is important that your doctor checks you again and that you report any new or remaining problems at that time.      24 HOUR PHARMACIES  The nearest 24 hour pharmacy is:    [facility]  [address]  [place], [place] [address]  [phone]      ASSISTANCE WITH INSURANCE    Affordable Care Act  [facility])  Call to start or finish an application, compare plans, enroll or ask a question.  [phone]  TTY: [phone]  Web:  Healthcare.gov    Help Enrolling in [facility]  [employer] [place]  857-676-5219 (TOLL-FREE)  [phone] (TTY)  Web:  Http://www.coverva.org    Local Help Enrolling in the [facility]  [place] [facility]  ([phone] (MAIN)  Email:  health-help@nvfs .org  Web:  BlackjackMyths.is  Address:  [address] [place], [place] [address]    SEDATING MEDICATIONS  Sedating medications include strong pain medications (e.g. narcotics), muscle relaxers, benzodiazepines (used for anxiety and as muscle relaxers), Benadryl/diphenhydramine and other antihistamines for allergic reactions/itching, and other medications.  If you are unsure if you have received a sedating medication, please ask your physician or nurse.  If you received a sedating medication: DO NOT drive a car. DO NOT operate machinery. DO NOT perform jobs where you need to be alert.  DO NOT drink alcoholic beverages while taking this medicine.     If you get dizzy, sit or lie down at the first signs. Be careful going up and down stairs.  Be extra careful to prevent falls.     Never give this medicine to others.     Keep this medicine out of reach of children.     Do not take or save old medicines. Throw them away when outdated.     Keep all medicines in a cool, dry place. DO NOT keep them in your bathroom medicine cabinet or in a cabinet  above the stove.    MEDICATION REFILLS  Please be aware that we cannot refill any prescriptions through the ER. If you need further treatment from what is provided at your ER visit, please follow up with your primary care doctor or your pain management specialist.    FREESTANDING EMERGENCY DEPARTMENTS OF [facility]  Did you know [facility] has two freestanding ERs located just a few miles away?  [facility] [place] and [facility] [place] have short wait times, easy free parking directly in front of the building and top patient satisfaction scores - and the same Board Certified Emergency Medicine doctors as [facility].

## 2023-05-07 NOTE — ED Provider Notes (Signed)
Lac Repair    Date/Time: 05/07/2023 4:48 AM    Performed by: Eula Flax, PA  Authorized by: Carmon Sails, MD    Consent:     Consent obtained:  Verbal    Consent given by:  Patient    Risks discussed:  Infection, pain, need for additional repair and poor cosmetic result  Universal protocol:     Patient identity confirmed:  Verbally with patient  Anesthesia:     Anesthesia method:  Local infiltration    Local anesthetic:  Lidocaine 2% WITH epi  Laceration details:     Location:  Face    Face location:  Chin    Length (cm):  2  Exploration:     Hemostasis achieved with:  Direct pressure and epinephrine    Wound exploration: wound explored through full range of motion and entire depth of wound visualized    Treatment:     Area cleansed with:  Saline    Amount of cleaning:  Standard    Irrigation solution:  Sterile saline    Irrigation method:  Pressure wash  Skin repair:     Repair method:  Sutures    Suture size:  5-0    Suture material:  Prolene    Suture technique:  Simple interrupted    Number of sutures:  4  Approximation:     Approximation:  Close  Repair type:     Repair type:  Simple  Post-procedure details:     Dressing:  Antibiotic ointment    Procedure completion:  Tolerated well, no immediate complications                 Eula Flax, [place]  05/07/23 [phone]
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
## 2023-05-07 NOTE — ED Provider Notes (Signed)
Patient made decision to leave from triage.  I did not evaluate this patient.

## 2023-05-07 NOTE — ED Notes (Signed)
Bed: S 13  Expected date:   Expected time:   Means of arrival:   Comments:  Medic 418
